# Patient Record
Sex: Female | Born: 1960 | Hispanic: Yes | Smoking: Former smoker
Health system: Southern US, Community
[De-identification: ages and names within clinical notes are randomized; demographics above are authoritative.]

## PROBLEM LIST (undated history)

## (undated) DIAGNOSIS — E7800 Pure hypercholesterolemia, unspecified: Secondary | ICD-10-CM

## (undated) HISTORY — DX: Pure hypercholesterolemia, unspecified: E78.00

## (undated) HISTORY — PX: TUBAL LIGATION: SHX77

---

## 2020-03-12 ENCOUNTER — Other Ambulatory Visit: Payer: Self-pay

## 2020-03-12 ENCOUNTER — Telehealth: Payer: Self-pay | Admitting: Osteopathic Medicine

## 2020-03-12 ENCOUNTER — Ambulatory Visit (INDEPENDENT_AMBULATORY_CARE_PROVIDER_SITE_OTHER): Payer: 59 | Admitting: Osteopathic Medicine

## 2020-03-12 ENCOUNTER — Encounter: Payer: Self-pay | Admitting: Osteopathic Medicine

## 2020-03-12 VITALS — BP 130/81 | HR 68 | Temp 98.1°F | Ht 60.0 in | Wt 106.1 lb

## 2020-03-12 DIAGNOSIS — E7849 Other hyperlipidemia: Secondary | ICD-10-CM | POA: Insufficient documentation

## 2020-03-12 DIAGNOSIS — M810 Age-related osteoporosis without current pathological fracture: Secondary | ICD-10-CM | POA: Diagnosis not present

## 2020-03-12 DIAGNOSIS — L989 Disorder of the skin and subcutaneous tissue, unspecified: Secondary | ICD-10-CM

## 2020-03-12 DIAGNOSIS — Z720 Tobacco use: Secondary | ICD-10-CM | POA: Insufficient documentation

## 2020-03-12 DIAGNOSIS — D7589 Other specified diseases of blood and blood-forming organs: Secondary | ICD-10-CM

## 2020-03-12 DIAGNOSIS — Z9851 Tubal ligation status: Secondary | ICD-10-CM | POA: Insufficient documentation

## 2020-03-12 DIAGNOSIS — E039 Hypothyroidism, unspecified: Secondary | ICD-10-CM | POA: Diagnosis not present

## 2020-03-12 DIAGNOSIS — R634 Abnormal weight loss: Secondary | ICD-10-CM | POA: Insufficient documentation

## 2020-03-12 DIAGNOSIS — R05 Cough: Secondary | ICD-10-CM | POA: Insufficient documentation

## 2020-03-12 DIAGNOSIS — R053 Chronic cough: Secondary | ICD-10-CM | POA: Insufficient documentation

## 2020-03-12 NOTE — Telephone Encounter (Signed)
Lauren Velasquez out of office.  Francesco Runner, since Dr Ashley Royalty is out this week also, can you work on this Percell Locus for patient please?   Thanks!

## 2020-03-12 NOTE — Progress Notes (Signed)
Lauren Velasquez is a 59 y.o. female who presents to  [facility] at [facility]  today, 03/12/20, seeking care for the following: . Establish care . See headings below      ASSESSMENT & PLAN with other pertinent history/findings:  The primary encounter diagnosis was Hypothyroidism, unspecified type. Diagnoses of Osteoporosis without current pathological fracture, unspecified osteoporosis type, Other hyperlipidemia, Skin lesion, Unintended weight loss, Tobacco use, History of bilateral tubal ligation, and Persistent dry cough were also pertinent to this visit.    1. Hypothyroidism, unspecified type Checking TSH No need for refills right now  2. Osteoporosis without current pathological fracture, unspecified osteoporosis type Due for DEXA Patient would like to try Prolia instead of Fosamax.  Often forgets to take weekly dose of Fosamax, not really interested in adding another daily medication at this time  3. Other hyperlipidemia Intolerant to statins  4. Skin lesion Benign mole on right side of neck, okay to monitor  5. Unintended weight loss Patient attributes this to low appetite due to grief after the death of her husband last year in May 2020 due to complications from COPD  6. Tobacco use Smoker, less than 1/2 pack/day for 30 years.  She would like to quit.  Planning on quitting this summer.  7. Persistent dry cough No shortness of breath, no mucus production, patient is a light smoker and attributes cough to this.  Would like to try quitting smoking before addressing cough issues.  Not meeting criteria for lung cancer screening at this time.       There are no Patient Instructions on file for this visit.   Orders Placed This Encounter  Procedures  . DG Bone Density  . CBC  . COMPLETE METABOLIC PANEL WITH GFR  . Lipid panel  . TSH  . VITAMIN D 25 Hydroxy (Vit-D Deficiency, Fractures)  . Urinalysis, Routine w reflex  microscopic    No orders of the defined types were placed in this encounter.      Follow-up instructions: Return in about 1 year (around 03/12/2021) for [place] (call week prior to visit for lab orders).              Constitutional:  . VSS, see nurse notes . General Appearance: alert, well-developed, well-nourished, NAD Eyes: Marland Kitchen Normal lids and conjunctive, non-icteric sclera . PERRLA Ears, Nose, Mouth, Throat: . Normal external auditory canal and TM bilaterally Neck: . No masses, trachea midline . No thyroid enlargement/tenderness/mass appreciated Respiratory: . Normal respiratory effort . No dullness/hyper-resonance to percussion . Breath sounds normal, no wheeze/rhonchi/rales Cardiovascular: . S1/S2 normal, no murmur/rub/gallop auscultated . No carotid bruit or JVD . No lower extremity edema Gastrointestinal: . Nontender, no masses . No hepatomegaly, no splenomegaly . No hernia appreciated Musculoskeletal:  . Gait normal . No clubbing/cyanosis of digits Neurological: . No cranial nerve deficit on limited exam . Motor and sensation intact and symmetric Psychiatric: . Normal judgment/insight . Normal mood and affect                            BP 130/81 (BP Location: Left Arm, Patient Position: Sitting, Cuff Size: Normal)   Pulse 68   Temp 98.1 F (36.7 C) (Oral)   Ht 5' (1.524 m)   Wt 106 lb 1.9 oz (48.1 kg)   BMI 20.73 kg/m   Current Meds  Medication Sig  . alendronate (FOSAMAX) 70 MG tablet Take 70 mg by  mouth once a week. Take with a full glass of water on an empty stomach.  . levothyroxine (SYNTHROID) 88 MCG tablet Take 88 mcg by mouth every morning.    No results found for this or any previous visit (from the past 72 hour(s)).  No results found.  Depression screen [facility] 2/9 03/12/2020  Decreased Interest 0  Down, Depressed, Hopeless 0  PHQ - 2 Score 0  Altered sleeping 0  Tired, decreased energy 0  Change in  appetite 0  Feeling bad or failure about yourself  0  Trouble concentrating 0  Moving slowly or fidgety/restless 0  Suicidal thoughts 0  PHQ-9 Score 0    GAD 7 : Generalized Anxiety Score 03/12/2020  Nervous, Anxious, on Edge 0  Control/stop worrying 0  Worry too much - different things 0  Trouble relaxing 0  Restless 0  Easily annoyed or irritable 0  Afraid - awful might happen 0  Total GAD 7 Score 0      All questions at time of visit were answered - patient instructed to contact office with any additional concerns or updates.  ER/RTC precautions were reviewed with the patient.  Please note: voice recognition software was used to produce this document, and typos may escape review. Please contact Dr. Sheppard Coil for any needed clarifications.

## 2020-03-12 NOTE — Telephone Encounter (Signed)
Can we get Prolia ordered for this patient? Please and thanks!

## 2020-03-13 ENCOUNTER — Other Ambulatory Visit: Payer: Self-pay | Admitting: Osteopathic Medicine

## 2020-03-13 LAB — LIPID PANEL
Cholesterol: 233 mg/dL — ABNORMAL HIGH (ref <200)
HDL: 58 mg/dL (ref >=50)
LDL Cholesterol (Calc): 155 mg/dL (calc) — ABNORMAL HIGH
Non-HDL Cholesterol (Calc): 175 mg/dL (calc) — ABNORMAL HIGH (ref <130)
Total CHOL/HDL Ratio: 4 (calc) (ref <5.0)
Triglycerides: 91 mg/dL (ref <150)

## 2020-03-13 LAB — CBC
HCT: 40.7 % (ref 35.0–45.0)
Hemoglobin: 14 g/dL (ref 11.7–15.5)
MCH: 37.4 pg — ABNORMAL HIGH (ref 27.0–33.0)
MCHC: 34.4 g/dL (ref 32.0–36.0)
MCV: 108.8 fL — ABNORMAL HIGH (ref 80.0–100.0)
MPV: 10.1 fL (ref 7.5–12.5)
Platelets: 306 10*3/uL (ref 140–400)
RBC: 3.74 10*6/uL — ABNORMAL LOW (ref 3.80–5.10)
RDW: 13.7 % (ref 11.0–15.0)
WBC: 6.2 10*3/uL (ref 3.8–10.8)

## 2020-03-13 LAB — URINALYSIS, ROUTINE W REFLEX MICROSCOPIC
Bilirubin Urine: NEGATIVE
Glucose, UA: NEGATIVE
Hgb urine dipstick: NEGATIVE
Ketones, ur: NEGATIVE
Leukocytes,Ua: NEGATIVE
Nitrite: NEGATIVE
Protein, ur: NEGATIVE
Specific Gravity, Urine: 1.013 (ref 1.001–1.03)
pH: 7.5 (ref 5.0–8.0)

## 2020-03-13 LAB — COMPLETE METABOLIC PANEL WITH GFR
AG Ratio: 2 (calc) (ref 1.0–2.5)
ALT: 10 U/L (ref 6–29)
AST: 12 U/L (ref 10–35)
Albumin: 4.4 g/dL (ref 3.6–5.1)
Alkaline phosphatase (APISO): 68 U/L (ref 37–153)
BUN: 13 mg/dL (ref 7–25)
CO2: 30 mmol/L (ref 20–32)
Calcium: 9.3 mg/dL (ref 8.6–10.4)
Chloride: 102 mmol/L (ref 98–110)
Creat: 0.59 mg/dL (ref 0.50–1.05)
GFR, Est African American: 116 mL/min/{1.73_m2} (ref >=60)
GFR, Est Non African American: 100 mL/min/{1.73_m2} (ref >=60)
Globulin: 2.2 g/dL (calc) (ref 1.9–3.7)
Glucose, Bld: 97 mg/dL (ref 65–99)
Potassium: 4.4 mmol/L (ref 3.5–5.3)
Sodium: 140 mmol/L (ref 135–146)
Total Bilirubin: 0.4 mg/dL (ref 0.2–1.2)
Total Protein: 6.6 g/dL (ref 6.1–8.1)

## 2020-03-13 LAB — TSH: TSH: 0.39 mIU/L — ABNORMAL LOW (ref 0.40–4.50)

## 2020-03-13 LAB — VITAMIN D 25 HYDROXY (VIT D DEFICIENCY, FRACTURES): Vit D, 25-Hydroxy: 19 ng/mL — ABNORMAL LOW (ref 30–100)

## 2020-03-13 MED ORDER — LEVOTHYROXINE SODIUM 75 MCG PO TABS: 75.0000 ug | ORAL_TABLET | Freq: Every morning | ORAL | 0 refills | Status: DC

## 2020-03-17 ENCOUNTER — Telehealth: Payer: Self-pay | Admitting: *Deleted

## 2020-03-17 MED ORDER — VITAMIN D (ERGOCALCIFEROL) 1.25 MG (50000 UNIT) PO CAPS: 50000.0000 [IU] | ORAL_CAPSULE | ORAL | 0 refills | Status: DC

## 2020-03-17 NOTE — Telephone Encounter (Signed)
Received a message from Fonda, certified tech at [employer] asking for clarification on patient's synthroid.  Per chart patient is supposed to be taking of synthroid.  [employer] went ahead and discontinued her old dose of and will fill the correct dose and mail to patient. Kaidon Kinker,CMA

## 2020-03-17 NOTE — Addendum Note (Signed)
Addended by: Deirdre Pippins on: 03/17/2020 05:05 PM   Modules accepted: Orders

## 2020-03-18 NOTE — Telephone Encounter (Signed)
I have submitted the information to insurance and waiting on a response.   

## 2020-03-19 NOTE — Telephone Encounter (Signed)
[name] with [facility] is processing the script for Prolia and needs the provider's NPI number.  I called back and gave him pcp's npi number.  [name] said that he just sent a fax over for chart notes on this medication.  Will forward to Dr. Mardelle Matte cma to make her aware.  Shailene Demonbreun,CMA

## 2020-03-23 ENCOUNTER — Telehealth: Payer: Self-pay

## 2020-03-23 NOTE — Telephone Encounter (Signed)
Pt lvm stating she had her COVID vaccine. She now has a rash on the arm. Seems to be subsiding now.

## 2020-03-25 ENCOUNTER — Ambulatory Visit (INDEPENDENT_AMBULATORY_CARE_PROVIDER_SITE_OTHER): Payer: 59

## 2020-03-25 ENCOUNTER — Telehealth: Payer: Self-pay

## 2020-03-25 ENCOUNTER — Other Ambulatory Visit: Payer: Self-pay

## 2020-03-25 DIAGNOSIS — E039 Hypothyroidism, unspecified: Secondary | ICD-10-CM | POA: Diagnosis not present

## 2020-03-25 DIAGNOSIS — M810 Age-related osteoporosis without current pathological fracture: Secondary | ICD-10-CM

## 2020-03-25 NOTE — Telephone Encounter (Signed)
We received a fax about a PA for Prolia. We need medical records of a Dexa bone scan and a history of patient taking Flomax and Actonel in the past. Tylisha did not sign a medical release form. I called patient and asked her to stop by to sign a medical release form.   Paperwork is on my desk.

## 2020-04-27 ENCOUNTER — Telehealth: Payer: Self-pay

## 2020-04-27 NOTE — Telephone Encounter (Signed)
Pt left a vm msg regarding high dose Vit D rx. Per pt she has been having side effects since she started taking the rx. Bone pain, weakness, lost of taste and loss of appetite. She wants to know whether she should continue taking the rx. Pls advise, thanks.

## 2020-04-27 NOTE — Telephone Encounter (Signed)
Couldn't say this would be due to Rx, very uncommon. Can stop it but if she's concerned she needs a visit to address these symptoms

## 2020-04-28 NOTE — Telephone Encounter (Signed)
Left a detailed vm msg for pt regarding provider's note. Pt is aware she can stop taking the medication if she wants. Direct call back info provided.

## 2020-05-02 ENCOUNTER — Other Ambulatory Visit: Payer: Self-pay | Admitting: Osteopathic Medicine

## 2020-05-28 ENCOUNTER — Other Ambulatory Visit: Payer: Self-pay | Admitting: Osteopathic Medicine

## 2020-06-30 ENCOUNTER — Telehealth: Payer: Self-pay

## 2020-06-30 NOTE — Telephone Encounter (Signed)
Patient contacted office about thyroid medication being refilled. We are unable to refill at this time to the dosage concerns. Patient had thyroid checked 04/001/2021. Provider requested patient get thyroid checked again to ensure dosage was to high. Patient never got the labs done. Patient was advised that orders were in get thyroid checked. Patient verbalized her understanding.

## 2020-07-01 MED ORDER — LEVOTHYROXINE SODIUM 75 MCG PO TABS: 75.0000 ug | ORAL_TABLET | Freq: Every morning | ORAL | 0 refills | Status: DC

## 2020-07-01 NOTE — Telephone Encounter (Signed)
Back but sent 15-day supply, patient needs labs done before additional refills will be authorized.

## 2020-07-02 LAB — B12 AND FOLATE PANEL
Folate: >24 ng/mL
Vitamin B-12: 198 pg/mL — ABNORMAL LOW (ref 200–1100)

## 2020-07-02 NOTE — Telephone Encounter (Signed)
Patient advised getting labs done.

## 2020-07-03 ENCOUNTER — Other Ambulatory Visit: Payer: Self-pay | Admitting: Osteopathic Medicine

## 2020-07-03 DIAGNOSIS — E538 Deficiency of other specified B group vitamins: Secondary | ICD-10-CM

## 2020-07-08 LAB — TEST AUTHORIZATION

## 2020-07-08 LAB — METHYLMALONIC ACID, SERUM: Methylmalonic Acid, Quant: 10500 nmol/L — ABNORMAL HIGH (ref 87–318)

## 2020-07-08 LAB — TSH: TSH: 17.19 mIU/L — ABNORMAL HIGH (ref 0.40–4.50)

## 2020-07-23 ENCOUNTER — Ambulatory Visit: Payer: 59 | Admitting: Osteopathic Medicine

## 2020-07-29 ENCOUNTER — Ambulatory Visit (INDEPENDENT_AMBULATORY_CARE_PROVIDER_SITE_OTHER): Payer: 59 | Admitting: Osteopathic Medicine

## 2020-07-29 ENCOUNTER — Encounter: Payer: Self-pay | Admitting: Osteopathic Medicine

## 2020-07-29 VITALS — BP 145/86 | HR 74 | Temp 98.2°F | Wt 110.0 lb

## 2020-07-29 DIAGNOSIS — E039 Hypothyroidism, unspecified: Secondary | ICD-10-CM | POA: Diagnosis not present

## 2020-07-29 DIAGNOSIS — E538 Deficiency of other specified B group vitamins: Secondary | ICD-10-CM

## 2020-07-29 DIAGNOSIS — E559 Vitamin D deficiency, unspecified: Secondary | ICD-10-CM | POA: Diagnosis not present

## 2020-07-29 DIAGNOSIS — Z23 Encounter for immunization: Secondary | ICD-10-CM

## 2020-07-29 MED ORDER — CYANOCOBALAMIN 1000 MCG/ML IJ SOLN
1000.0000 ug | Freq: Once | INTRAMUSCULAR | Status: AC
  Administered 2020-07-29: 1000 ug via INTRAMUSCULAR

## 2020-07-29 MED ORDER — LEVOTHYROXINE SODIUM 75 MCG PO TABS: 75.0000 ug | ORAL_TABLET | Freq: Every morning | ORAL | 0 refills | Status: DC

## 2020-07-29 MED ORDER — ALENDRONATE SODIUM 70 MG PO TABS: 70.0000 mg | ORAL_TABLET | ORAL | 3 refills | Status: DC

## 2020-07-29 NOTE — Patient Instructions (Addendum)
Plan: --> B12 injections weekly for 4 weeks, then transition to high-dose oral supplement (2000 mcg daily). If levels go to normal, can continue with daily oral supplement and recheck again in a few months. If levels go back down, would put you back onto injections once per month.  --> Levothyroxine 75 mcg daily, NO skipped days!  --> Will recheck all labs in 6-8 weeks      Vitamin B12 Deficiency Vitamin B12 deficiency occurs when the body does not have enough vitamin B12, which is an important vitamin. The body needs this vitamin:  To make red blood cells.  To make DNA. This is the genetic material inside cells.  To help the nerves work properly so they can carry messages from the brain to the body. Vitamin B12 deficiency can cause various health problems, such as a low red blood cell count (anemia) or nerve damage. What are the causes? This condition may be caused by:  Not eating enough foods that contain vitamin B12.  Not having enough stomach acid and digestive fluids to properly absorb vitamin B12 from the food that you eat.  Certain digestive system diseases that make it hard to absorb vitamin B12. These diseases include Crohn's disease, chronic pancreatitis, and cystic fibrosis.  A condition in which the body does not make enough of a protein (intrinsic factor), resulting in too few red blood cells (pernicious anemia).  Having a surgery in which part of the stomach or small intestine is removed.  Taking certain medicines that make it hard for the body to absorb vitamin B12. These medicines include: ? Heartburn medicines (antacids and proton pump inhibitors). ? Certain antibiotic medicines. ? Some medicines that are used to treat diabetes, tuberculosis, gout, or high cholesterol. What increases the risk? The following factors may make you more likely to develop a B12 deficiency:  Being older than age 48.  Eating a vegetarian or vegan diet, especially while you are  pregnant.  Eating a poor diet while you are pregnant.  Taking certain medicines.  Having alcoholism. What are the signs or symptoms? In some cases, there are no symptoms of this condition. If the condition leads to anemia or nerve damage, various symptoms can occur, such as:  Weakness.  Fatigue.  Loss of appetite.  Weight loss.  Numbness or tingling in your hands and feet.  Redness and burning of the tongue.  Confusion or memory problems.  Depression.  Sensory problems, such as color blindness, ringing in the ears, or loss of taste.  Diarrhea or constipation.  Trouble walking. If anemia is severe, symptoms can include:  Shortness of breath.  Dizziness.  Rapid heart rate (tachycardia). How is this diagnosed? This condition may be diagnosed with a blood test to measure the level of vitamin B12 in your blood. You may also have other tests, including:  A group of tests that measure certain characteristics of blood cells (complete blood count, CBC).  A blood test to measure intrinsic factor.  A procedure where a thin tube with a camera on the end is used to look into your stomach or intestines (endoscopy). Other tests may be needed to discover the cause of B12 deficiency. How is this treated? Treatment for this condition depends on the cause. This condition may be treated by:  Changing your eating and drinking habits, such as: ? Eating more foods that contain vitamin B12. ? Drinking less alcohol or no alcohol.  Getting vitamin B12 injections.  Taking vitamin B12 supplements. Your health care  provider will tell you which dosage is best for you. Follow these instructions at home: Eating and drinking   Eat lots of healthy foods that contain vitamin B12, including: ? Meats and poultry. This includes beef, pork, chicken, [place], and organ meats, such as liver. ? Seafood. This includes clams, rainbow trout, salmon, tuna, and haddock. ? Eggs. ? Cereal and dairy  products that are fortified. This means that vitamin B12 has been added to the food. Check the label on the package to see if the food is fortified. The items listed above may not be a complete list of recommended foods and beverages. Contact a dietitian for more information. General instructions  Get any injections that are prescribed by your health care provider.  Take supplements only as told by your health care provider. Follow the directions carefully.  Do not drink alcohol if your health care provider tells you not to. In some cases, you may only be asked to limit alcohol use.  Keep all follow-up visits as told by your health care provider. This is important. Contact a health care provider if:  Your symptoms come back. Get help right away if you:  Develop shortness of breath.  Have a rapid heart rate.  Have chest pain.  Become dizzy or lose consciousness. Summary  Vitamin B12 deficiency occurs when the body does not have enough vitamin B12.  The main causes of vitamin B12 deficiency include dietary deficiency, digestive diseases, pernicious anemia, and having a surgery in which part of the stomach or small intestine is removed.  In some cases, there are no symptoms of this condition. If the condition leads to anemia or nerve damage, various symptoms can occur, such as weakness, shortness of breath, and numbness.  Treatment may include getting vitamin B12 injections or taking vitamin B12 supplements. Eat lots of healthy foods that contain vitamin B12. This information is not intended to replace advice given to you by your health care provider. Make sure you discuss any questions you have with your health care provider. Document Revised: 05/17/2019 Document Reviewed: 08/07/2018 Elsevier Patient Education  2020 [employer].

## 2020-07-29 NOTE — Progress Notes (Signed)
Lauren Velasquez is a 59 y.o. female who presents to  [facility] at [facility]  today, 07/29/20, seeking care for the following:   Go over labs, B12 deficiency, abnormal thyroid levels, vitamin D deficiency.  With last TSH being slightly below normal, we advise skip 1 dose of medication per week, TSH went up to 17.    ASSESSMENT & PLAN with other pertinent findings:  The primary encounter diagnosis was B12 deficiency. Diagnoses of Need for influenza vaccination, Need for shingles vaccine, Vitamin D deficiency, Hypothyroidism, unspecified type, and B12 deficiency with macrocytosis, elevated methylmalonic acid.  Pending intrinsic factor antibody evaluation were also pertinent to this visit.   No results found for this or any previous visit (from the past 24 hour(s)).     Patient Instructions  Plan: --> B12 injections weekly for 4 weeks, then transition to high-dose oral supplement (2000 mcg daily). If levels go to normal, can continue with daily oral supplement and recheck again in a few months. If levels go back down, would put you back onto injections once per month.  --> Levothyroxine 75 mcg daily, NO skipped days!  --> Will recheck all labs in 6-8 weeks    Additional information printed regarding B12 deficiency/possible pernicious anemia.     Orders Placed This Encounter  Procedures  . Varicella-zoster vaccine IM (Shingrix)  . Flu Vaccine QUAD 6+ mos PF IM (Fluarix Quad PF)  . Intrinsic Factor Antibodies  . TSH  . Vitamin B12  . VITAMIN D 25 Hydroxy (Vit-D Deficiency, Fractures)  . CBC    Meds ordered this encounter  Medications  . cyanocobalamin ((VITAMIN B-12)) injection 1,000 mcg  . levothyroxine (SYNTHROID) 75 MCG tablet    Sig: Take 1 tablet (75 mcg total) by mouth every morning.    Dispense:  90 tablet    Refill:  0  . alendronate (FOSAMAX) 70 MG tablet    Sig: Take 1 tablet (70 mg total) by mouth  once a week. Take with a full glass of water on an empty stomach.    Dispense:  12 tablet    Refill:  3       Follow-up instructions: Return for nurse visit B12 injections: 1 week, 2 weeks, 3 weeks from today. LAB around 09/09/20 & 12/09/20.                                         BP (!) 145/86 (BP Location: Right Arm, Patient Position: Sitting, Cuff Size: Normal)   Pulse 74   Temp 98.2 F (36.8 C) (Oral)   Wt 110 lb (49.9 kg)   BMI 21.48 kg/m   Current Meds  Medication Sig  . alendronate (FOSAMAX) 70 MG tablet Take 1 tablet (70 mg total) by mouth once a week. Take with a full glass of water on an empty stomach.  . levothyroxine (SYNTHROID) 75 MCG tablet Take 1 tablet (75 mcg total) by mouth every morning.  . Vitamin D, Ergocalciferol, (DRISDOL) 1.25 MG (50000 UNIT) CAPS capsule Take 1 capsule (50,000 Units total) by mouth every 7 (seven) days. Take for 12 total doses(weeks) than can transition to 1000 units OTC supplement daily  . [DISCONTINUED] alendronate (FOSAMAX) 70 MG tablet Take 70 mg by mouth once a week. Take with a full glass of water on an empty stomach.  . [DISCONTINUED] levothyroxine (SYNTHROID) 75 MCG tablet  Take 1 tablet (75 mcg total) by mouth every morning. Skip 1 dose per week **PATIENT NEEDS LABS DONE FOR ADDITIONAL REFILLS**    No results found for this or any previous visit (from the past 72 hour(s)).  No results found.     All questions at time of visit were answered - patient instructed to contact office with any additional concerns or updates.  ER/RTC precautions were reviewed with the patient as applicable.   Please note: voice recognition software was used to produce this document, and typos may escape review. Please contact Dr. Lyn Hollingshead for any needed clarifications.   Total encounter time: 30 minutes.

## 2020-08-05 ENCOUNTER — Encounter: Payer: Self-pay | Admitting: Osteopathic Medicine

## 2020-08-05 ENCOUNTER — Ambulatory Visit (INDEPENDENT_AMBULATORY_CARE_PROVIDER_SITE_OTHER): Payer: 59 | Admitting: Osteopathic Medicine

## 2020-08-05 VITALS — BP 152/91 | HR 72 | Temp 98.0°F

## 2020-08-05 DIAGNOSIS — E538 Deficiency of other specified B group vitamins: Secondary | ICD-10-CM

## 2020-08-05 MED ORDER — CYANOCOBALAMIN 1000 MCG/ML IJ SOLN
1000.0000 ug | Freq: Once | INTRAMUSCULAR | Status: AC
  Administered 2020-08-05: 1000 ug via INTRAMUSCULAR

## 2020-08-05 NOTE — Progress Notes (Signed)
Patient presents today for cyanocobalamin 1000 mcg/1 ml injection. Patient is scheduled to get this injection every week for the next 4 weeks. This is the patients first injection.   Patient denies CP, palpitations, ShOB, dizziness/lightheadedness, abdominal pain, headache, and mood swings.   Pt's BP was elevated upon intake. She stated that her BP was elevated the last time that she was here. I told her that we would recheck her BP and pt declined recheck.   Injection given in left arm. Pt tolerated injection well without complications. Pt is already scheduled to return on 08/12/2020 for her next injection. Marland Kitchen

## 2020-08-12 ENCOUNTER — Other Ambulatory Visit: Payer: Self-pay

## 2020-08-12 ENCOUNTER — Ambulatory Visit (INDEPENDENT_AMBULATORY_CARE_PROVIDER_SITE_OTHER): Payer: 59 | Admitting: Osteopathic Medicine

## 2020-08-12 VITALS — BP 132/74 | HR 77 | Ht 60.0 in | Wt 110.0 lb

## 2020-08-12 DIAGNOSIS — E538 Deficiency of other specified B group vitamins: Secondary | ICD-10-CM

## 2020-08-12 MED ORDER — CYANOCOBALAMIN 1000 MCG/ML IJ SOLN
1000.0000 ug | Freq: Once | INTRAMUSCULAR | Status: AC
  Administered 2020-08-12: 1000 ug via INTRAMUSCULAR

## 2020-08-12 NOTE — Progress Notes (Signed)
   Subjective:    Patient ID: Lauren Velasquez, female    DOB: November 13, 1961, 59 y.o.   MRN: [id number]  HPI Patient is here for a Vitamin B12 injection. Denied any gastrointestional problems or dizziness.    Review of Systems     Objective:   Physical Exam        Assessment & Plan:  Patient tolerated injection well without complication. Patient aware to come back for lab re-check in 2-4 weeks

## 2020-08-19 ENCOUNTER — Ambulatory Visit (INDEPENDENT_AMBULATORY_CARE_PROVIDER_SITE_OTHER): Payer: 59 | Admitting: Osteopathic Medicine

## 2020-08-19 VITALS — BP 140/68 | HR 71 | Wt 110.0 lb

## 2020-08-19 DIAGNOSIS — E538 Deficiency of other specified B group vitamins: Secondary | ICD-10-CM

## 2020-08-19 MED ORDER — CYANOCOBALAMIN 1000 MCG/ML IJ SOLN
1000.0000 ug | Freq: Once | INTRAMUSCULAR | Status: AC
  Administered 2020-08-19: 1000 ug via INTRAMUSCULAR

## 2020-08-19 NOTE — Progress Notes (Signed)
Established Patient Office Visit  Subjective:  Patient ID: Lauren Velasquez, female    DOB: June 20, 1961  Age: 59 y.o. MRN: [id number]  CC:  Chief Complaint  Patient presents with  . Pernicious Anemia    HPI  Glory Rosebush is here for a vitamin B 12 injection. Denies muscle cramps, weakness or irregular heart rate.   Past Medical History:  Diagnosis Date  . High cholesterol     Past Surgical History:  Procedure Laterality Date  . TUBAL LIGATION Bilateral     Family History  Family history unknown: Yes    Social History   Socioeconomic History  . Marital status: Widowed    Spouse name: Not on file  . Number of children: 2  . Years of education: Not on file  . Highest education level: Not on file  Occupational History  . Not on file  Tobacco Use  . Smoking status: Former Smoker    Types: Cigarettes    Quit date: 1991    Years since quitting: 30.7  . Smokeless tobacco: Never Used  Vaping Use  . Vaping Use: Never used  Substance and Sexual Activity  . Alcohol use: Yes  . Drug use: Never  . Sexual activity: Not Currently    Partners: Male    Birth control/protection: Post-menopausal, Abstinence  Other Topics Concern  . Not on file  Social History Narrative  . Not on file   Social Determinants of Health   Financial Resource Strain:   . Difficulty of Paying Living Expenses: Not on file  Food Insecurity:   . Worried About Programme researcher, broadcasting/film/video in the Last Year: Not on file  . Ran Out of Food in the Last Year: Not on file  Transportation Needs:   . Lack of Transportation (Medical): Not on file  . Lack of Transportation (Non-Medical): Not on file  Physical Activity:   . Days of Exercise per Week: Not on file  . Minutes of Exercise per Session: Not on file  Stress:   . Feeling of Stress : Not on file  Social Connections:   . Frequency of Communication with Friends and Family: Not on file  . Frequency of Social Gatherings with  Friends and Family: Not on file  . Attends Religious Services: Not on file  . Active Member of Clubs or Organizations: Not on file  . Attends Banker Meetings: Not on file  . Marital Status: Not on file  Intimate Partner Violence:   . Fear of Current or Ex-Partner: Not on file  . Emotionally Abused: Not on file  . Physically Abused: Not on file  . Sexually Abused: Not on file    Outpatient Medications Prior to Visit  Medication Sig Dispense Refill  . alendronate (FOSAMAX) 70 MG tablet Take 1 tablet (70 mg total) by mouth once a week. Take with a full glass of water on an empty stomach. 12 tablet 3  . cyanocobalamin (,VITAMIN B-12,) 1000 MCG/ML injection Inject 1,000 mcg into the muscle once a week.    . levothyroxine (SYNTHROID) 75 MCG tablet Take 1 tablet (75 mcg total) by mouth every morning. 90 tablet 0  . Vitamin D, Ergocalciferol, (DRISDOL) 1.25 MG (50000 UNIT) CAPS capsule Take 1 capsule (50,000 Units total) by mouth every 7 (seven) days. Take for 12 total doses(weeks) than can transition to 1000 units OTC supplement daily 12 capsule 0   No facility-administered medications prior to visit.    No Known Allergies  ROS Review of Systems    Objective:    Physical Exam  BP 140/68   Pulse 71   Wt 110 lb (49.9 kg)   SpO2 100%   BMI 21.48 kg/m  Wt Readings from Last 3 Encounters:  08/19/20 110 lb (49.9 kg)  08/12/20 110 lb (49.9 kg)  07/29/20 110 lb (49.9 kg)     Health Maintenance Due  Topic Date Due  . Hepatitis C Screening  Never done  . COVID-19 Vaccine (1) Never done  . HIV Screening  Never done  . PAP SMEAR-Modifier  Never done  . MAMMOGRAM  Never done  . COLONOSCOPY  Never done    There are no preventive care reminders to display for this patient.  Lab Results  Component Value Date   TSH 17.19 (H) 07/02/2020   Lab Results  Component Value Date   WBC 6.2 03/12/2020   HGB 14.0 03/12/2020   HCT 40.7 03/12/2020   MCV 108.8 (H) 03/12/2020    PLT 306 03/12/2020   Lab Results  Component Value Date   NA 140 03/12/2020   K 4.4 03/12/2020   CO2 30 03/12/2020   GLUCOSE 97 03/12/2020   BUN 13 03/12/2020   CREATININE 0.59 03/12/2020   BILITOT 0.4 03/12/2020   AST 12 03/12/2020   ALT 10 03/12/2020   PROT 6.6 03/12/2020   CALCIUM 9.3 03/12/2020   Lab Results  Component Value Date   CHOL 233 (H) 03/12/2020   Lab Results  Component Value Date   HDL 58 03/12/2020   Lab Results  Component Value Date   LDLCALC 155 (H) 03/12/2020   Lab Results  Component Value Date   TRIG 91 03/12/2020   Lab Results  Component Value Date   CHOLHDL 4.0 03/12/2020   No results found for: HGBA1C    Assessment & Plan:  B12 deficiency - Patient tolerated injection well without complications. Patient advised to schedule next injection 7 days from today. Advised to go to the lab before next injection.    Problem List Items Addressed This Visit    B12 deficiency with macrocytosis, elevated methylmalonic acid.  Pending intrinsic factor antibody evaluation - Primary      Meds ordered this encounter  Medications  . cyanocobalamin ((VITAMIN B-12)) injection 1,000 mcg    Follow-up: Return in about 1 week (around 08/26/2020) for B12 injection. Earna Coder, Janalyn Harder, CMA

## 2020-08-29 LAB — CBC
HCT: 43.6 % (ref 35.0–45.0)
Hemoglobin: 15 g/dL (ref 11.7–15.5)
MCH: 32.3 pg (ref 27.0–33.0)
MCHC: 34.4 g/dL (ref 32.0–36.0)
MCV: 94 fL (ref 80.0–100.0)
MPV: 11.3 fL (ref 7.5–12.5)
Platelets: 276 10*3/uL (ref 140–400)
RBC: 4.64 10*6/uL (ref 3.80–5.10)
RDW: 13.3 % (ref 11.0–15.0)
WBC: 5.4 10*3/uL (ref 3.8–10.8)

## 2020-08-29 LAB — INTRINSIC FACTOR ANTIBODIES: Intrinsic Factor: NEGATIVE

## 2020-08-29 LAB — TSH: TSH: 15.59 mIU/L — ABNORMAL HIGH (ref 0.40–4.50)

## 2020-08-29 LAB — VITAMIN D 25 HYDROXY (VIT D DEFICIENCY, FRACTURES): Vit D, 25-Hydroxy: 43 ng/mL (ref 30–100)

## 2020-08-29 LAB — VITAMIN B12: Vitamin B-12: 392 pg/mL (ref 200–1100)

## 2020-09-01 ENCOUNTER — Other Ambulatory Visit: Payer: Self-pay | Admitting: Osteopathic Medicine

## 2020-09-01 DIAGNOSIS — E538 Deficiency of other specified B group vitamins: Secondary | ICD-10-CM

## 2020-09-01 DIAGNOSIS — E039 Hypothyroidism, unspecified: Secondary | ICD-10-CM

## 2020-09-01 MED ORDER — LEVOTHYROXINE SODIUM 100 MCG PO TABS
100.0000 ug | ORAL_TABLET | Freq: Every morning | ORAL | 0 refills | Status: DC
Start: 2020-09-01 — End: 2020-10-08

## 2020-10-07 ENCOUNTER — Other Ambulatory Visit: Payer: Self-pay | Admitting: Osteopathic Medicine

## 2020-10-30 ENCOUNTER — Other Ambulatory Visit: Payer: Self-pay

## 2020-10-30 ENCOUNTER — Other Ambulatory Visit: Payer: Self-pay | Admitting: Osteopathic Medicine

## 2020-10-30 LAB — VITAMIN B12: Vitamin B-12: 211 pg/mL (ref 200–1100)

## 2020-10-30 LAB — TSH: TSH: 1.19 mIU/L (ref 0.40–4.50)

## 2020-10-30 MED ORDER — ALENDRONATE SODIUM 70 MG PO TABS
70.0000 mg | ORAL_TABLET | ORAL | 3 refills | Status: DC
Start: 2020-10-30 — End: 2021-10-11

## 2020-10-30 MED ORDER — LEVOTHYROXINE SODIUM 100 MCG PO TABS
ORAL_TABLET | ORAL | 3 refills | Status: DC
Start: 2020-10-30 — End: 2021-10-11

## 2021-05-28 ENCOUNTER — Telehealth: Payer: Self-pay

## 2021-05-28 NOTE — Telephone Encounter (Signed)
Patient left a vm msg stating she tested positive today for Covid. Per pt, has a slight cold. Denies any major symptoms. Pt is requesting antiviral medication. Please contact the patient to schedule an appointment with provider. Thanks in advance.

## 2021-05-31 ENCOUNTER — Telehealth (INDEPENDENT_AMBULATORY_CARE_PROVIDER_SITE_OTHER): Payer: 59 | Admitting: Family Medicine

## 2021-05-31 ENCOUNTER — Encounter: Payer: Self-pay | Admitting: Family Medicine

## 2021-05-31 DIAGNOSIS — U071 COVID-19: Secondary | ICD-10-CM

## 2021-05-31 NOTE — Telephone Encounter (Signed)
Patient has been scheduled for a virtual visit this morning with Hyman Hopes. AM

## 2021-05-31 NOTE — Progress Notes (Signed)
Virtual Video Visit via MyChart Note  I connected with  Lauren Velasquez on 05/31/21 at  9:10 AM EDT by the video enabled telemedicine application for MyChart, and verified that I am speaking with the correct person using two identifiers.   I introduced myself as a Publishing rights manager with the practice. We discussed the limitations of evaluation and management by telemedicine and the availability of in person appointments. The patient expressed understanding and agreed to proceed.  Participating parties in this visit include: The patient and the nurse practitioner listed.  The patient is: At home I am: In the office - Primary Care Kathryne Sharper  Subjective:    CC:  Chief Complaint  Patient presents with   Covid Positive    HPI: Lauren Velasquez is a 60 y.o. year old female presenting today via MyChart today for COVID infection.  Patient recently flew to and from [place] to sell a home. Reports she took a COVID test when she got back home and was positive (05/27/21). She states she has been feeling great- did have some mild cold symptoms initially, but denies any other symptoms. She is fully vaccinated plus booster.  She is asking how long she should quarantine and if she needs an antiviral.     Past medical history, Surgical history, Family history not pertinant except as noted below, Social history, Allergies, and medications have been entered into the medical record, reviewed, and corrections made.   Review of Systems:  All review of systems negative except what is listed in the HPI   Objective:    General:  Speaking clearly in complete sentences. Absent shortness of breath noted.   Alert and oriented x3.   Normal judgment.  Absent acute distress.   Impression and Recommendations:    1. COVID-19 Patient doing great, no symptoms other than mild nasal congestion. She is on day 5 since positive test and not high-risk. I do not think antivirals are  indicated - discussed risks vs benefits and indications, she agrees that it is not necessary. Repeat testing not indicated. She plans to isolate for the full 10 days before being around her grandchildren. Patient aware of signs/symptoms requiring further/urgent evaluation.    Follow-up if symptoms worsen or fail to improve.    I discussed the assessment and treatment plan with the patient. The patient was provided an opportunity to ask questions and all were answered. The patient agreed with the plan and demonstrated an understanding of the instructions.   The patient was advised to call back or seek an in-person evaluation if the symptoms worsen or if the condition fails to improve as anticipated.  I provided 20 minutes of non-face-to-face interaction with this MYCHART visit including intake, same-day documentation, and chart review.   Clayborne Dana, NP

## 2021-10-09 ENCOUNTER — Other Ambulatory Visit: Payer: Self-pay | Admitting: Osteopathic Medicine

## 2022-02-10 ENCOUNTER — Other Ambulatory Visit: Payer: Self-pay

## 2022-02-10 MED ORDER — ALENDRONATE SODIUM 70 MG PO TABS: 70.0000 mg | ORAL_TABLET | ORAL | 0 refills | Status: DC

## 2022-03-07 ENCOUNTER — Telehealth: Payer: Self-pay

## 2022-03-07 ENCOUNTER — Other Ambulatory Visit: Payer: Self-pay | Admitting: Osteopathic Medicine

## 2022-03-07 NOTE — Telephone Encounter (Signed)
Patient has been scheduled for a med refill appointment.gh ?

## 2022-03-07 NOTE — Telephone Encounter (Signed)
Previous Dr. Sheppard Coil patient ? ?Patient is scheduled for Transfer of Care appt on May 12 with Joy. Patient will be out of Synthroid before then. Can a refill be sent to get her to her appt or will she need to be scheduled for a med refill appt with any provider? ?

## 2022-03-16 ENCOUNTER — Ambulatory Visit (INDEPENDENT_AMBULATORY_CARE_PROVIDER_SITE_OTHER): Payer: 59 | Admitting: Physician Assistant

## 2022-03-16 ENCOUNTER — Encounter: Payer: Self-pay | Admitting: Physician Assistant

## 2022-03-16 VITALS — BP 135/63 | HR 56 | Ht 60.0 in | Wt 126.0 lb

## 2022-03-16 DIAGNOSIS — E039 Hypothyroidism, unspecified: Secondary | ICD-10-CM

## 2022-03-16 DIAGNOSIS — E559 Vitamin D deficiency, unspecified: Secondary | ICD-10-CM

## 2022-03-16 DIAGNOSIS — Z1231 Encounter for screening mammogram for malignant neoplasm of breast: Secondary | ICD-10-CM

## 2022-03-16 DIAGNOSIS — E538 Deficiency of other specified B group vitamins: Secondary | ICD-10-CM | POA: Diagnosis not present

## 2022-03-16 DIAGNOSIS — M810 Age-related osteoporosis without current pathological fracture: Secondary | ICD-10-CM

## 2022-03-16 DIAGNOSIS — E7849 Other hyperlipidemia: Secondary | ICD-10-CM

## 2022-03-16 DIAGNOSIS — Z79899 Other long term (current) drug therapy: Secondary | ICD-10-CM

## 2022-03-16 DIAGNOSIS — Z131 Encounter for screening for diabetes mellitus: Secondary | ICD-10-CM

## 2022-03-16 NOTE — Progress Notes (Signed)
? ?Subjective:  ? ? Patient ID: Lauren Velasquez, female    DOB: 09-12-1961, 61 y.o.   MRN: [id number] ? ?HPI ?Pt is a 61 yo female whose PCP left practice and she needed refills. She states "refills were sent so she doesn't know why she is here now". She has establish care with Lauren Butter NP on 5/12.  ? ?No problems or concerns.  ? ?.. ?Active Ambulatory Problems  ?  Diagnosis Date Noted  ? Hypothyroidism 03/12/2020  ? Osteoporosis without current pathological fracture 03/12/2020  ? Other hyperlipidemia 03/12/2020  ? Unintended weight loss 03/12/2020  ? Tobacco use 03/12/2020  ? History of bilateral tubal ligation 03/12/2020  ? Persistent dry cough 03/12/2020  ? B12 deficiency with macrocytosis, elevated methylmalonic acid.  Pending intrinsic factor antibody evaluation 07/29/2020  ? ?Resolved Ambulatory Problems  ?  Diagnosis Date Noted  ? No Resolved Ambulatory Problems  ? ?Past Medical History:  ?Diagnosis Date  ? High cholesterol   ? ? ? ? ? ?Review of Systems  ?All other systems reviewed and are negative. ? ?   ?Objective:  ? Physical Exam ?Vitals reviewed.  ?Constitutional:   ?   Appearance: Normal appearance.  ?HENT:  ?   Head: Normocephalic.  ?Cardiovascular:  ?   Rate and Rhythm: Normal rate and regular rhythm.  ?   Pulses: Normal pulses.  ?   Heart sounds: Normal heart sounds.  ?Musculoskeletal:  ?   Right lower leg: No edema.  ?   Left lower leg: No edema.  ?Neurological:  ?   General: No focal deficit present.  ?   Mental Status: She is alert and oriented to person, place, and time.  ?Psychiatric:     ?   Mood and Affect: Mood normal.  ? ?.. ? ?  03/16/2022  ? 10:44 AM 03/12/2020  ? 11:38 AM  ?Depression screen PHQ 2/9  ?Decreased Interest 0 0  ?Down, Depressed, Hopeless 0 0  ?PHQ - 2 Score 0 0  ?Altered sleeping  0  ?Tired, decreased energy  0  ?Change in appetite  0  ?Feeling bad or failure about yourself   0  ?Trouble concentrating  0  ?Moving slowly or fidgety/restless  0  ?Suicidal thoughts   0  ?PHQ-9 Score  0  ? ?.. ? ?  03/12/2020  ? 11:38 AM  ?GAD 7 : Generalized Anxiety Score  ?Nervous, Anxious, on Edge 0  ?Control/stop worrying 0  ?Worry too much - different things 0  ?Trouble relaxing 0  ?Restless 0  ?Easily annoyed or irritable 0  ?Afraid - awful might happen 0  ?Total GAD 7 Score 0  ? ? ? ? ? ? ?   ?Assessment & Plan:  ?..Ta was seen today for follow-up and hypothyroidism. ? ?Diagnoses and all orders for this visit: ? ?Hypothyroidism, unspecified type ?-     TSH ? ?B12 deficiency ?-     Vitamin B12 ? ?Vitamin D deficiency ?-     Vitamin D (25 hydroxy) ? ?Other hyperlipidemia ?-     Lipid Panel w/reflex Direct LDL ? ?Diabetes mellitus screening ?-     COMPLETE METABOLIC PANEL WITH GFR ? ?Medication management ?-     TSH ?-     Lipid Panel w/reflex Direct LDL ?-     COMPLETE METABOLIC PANEL WITH GFR ?-     CBC with Differential/Platelet ? ?Visit for screening mammogram ?-     MM DIGITAL SCREENING BILATERAL; Future ? ?Age-related  osteoporosis without current pathological fracture ?-     DG Bone Density; Future ? ? ?She is due for labs. Ordered today.  ?No refills needed but ok any refills until she sees Joy to establish care.  ?Discussed getting any records so we can up date EMR.  ?Mammogram and bone density ordered.  ?

## 2022-03-17 ENCOUNTER — Other Ambulatory Visit: Payer: Self-pay | Admitting: Physician Assistant

## 2022-03-17 DIAGNOSIS — E7849 Other hyperlipidemia: Secondary | ICD-10-CM

## 2022-03-17 LAB — CBC WITH DIFFERENTIAL/PLATELET
Absolute Monocytes: 347 cells/uL (ref 200–950)
Basophils Absolute: 48 cells/uL (ref 0–200)
Basophils Relative: 0.7 %
Eosinophils Absolute: 218 cells/uL (ref 15–500)
Eosinophils Relative: 3.2 %
HCT: 43 % (ref 35.0–45.0)
Hemoglobin: 14.3 g/dL (ref 11.7–15.5)
Lymphs Abs: 1802 cells/uL (ref 850–3900)
MCH: 31.3 pg (ref 27.0–33.0)
MCHC: 33.3 g/dL (ref 32.0–36.0)
MCV: 94.1 fL (ref 80.0–100.0)
MPV: 11.7 fL (ref 7.5–12.5)
Monocytes Relative: 5.1 %
Neutro Abs: 4386 cells/uL (ref 1500–7800)
Neutrophils Relative %: 64.5 %
Platelets: 277 10*3/uL (ref 140–400)
RBC: 4.57 10*6/uL (ref 3.80–5.10)
RDW: 12.8 % (ref 11.0–15.0)
Total Lymphocyte: 26.5 %
WBC: 6.8 10*3/uL (ref 3.8–10.8)

## 2022-03-17 LAB — LIPID PANEL W/REFLEX DIRECT LDL
Cholesterol: 258 mg/dL — ABNORMAL HIGH (ref <200)
HDL: 57 mg/dL (ref >=50)
LDL Cholesterol (Calc): 178 mg/dL (calc) — ABNORMAL HIGH
Non-HDL Cholesterol (Calc): 201 mg/dL (calc) — ABNORMAL HIGH (ref <130)
Total CHOL/HDL Ratio: 4.5 (calc) (ref <5.0)
Triglycerides: 106 mg/dL (ref <150)

## 2022-03-17 LAB — VITAMIN D 25 HYDROXY (VIT D DEFICIENCY, FRACTURES): Vit D, 25-Hydroxy: 33 ng/mL (ref 30–100)

## 2022-03-17 LAB — COMPLETE METABOLIC PANEL WITH GFR
AG Ratio: 1.7 (calc) (ref 1.0–2.5)
ALT: 13 U/L (ref 6–29)
AST: 12 U/L (ref 10–35)
Albumin: 4.3 g/dL (ref 3.6–5.1)
Alkaline phosphatase (APISO): 86 U/L (ref 37–153)
BUN: 13 mg/dL (ref 7–25)
CO2: 31 mmol/L (ref 20–32)
Calcium: 9.7 mg/dL (ref 8.6–10.4)
Chloride: 103 mmol/L (ref 98–110)
Creat: 0.63 mg/dL (ref 0.50–1.05)
Globulin: 2.6 g/dL (calc) (ref 1.9–3.7)
Glucose, Bld: 91 mg/dL (ref 65–139)
Potassium: 4.4 mmol/L (ref 3.5–5.3)
Sodium: 139 mmol/L (ref 135–146)
Total Bilirubin: 0.4 mg/dL (ref 0.2–1.2)
Total Protein: 6.9 g/dL (ref 6.1–8.1)
eGFR: 101 mL/min/{1.73_m2} (ref >=60)

## 2022-03-17 LAB — TSH: TSH: 0.18 mIU/L — ABNORMAL LOW (ref 0.40–4.50)

## 2022-03-17 LAB — VITAMIN B12: Vitamin B-12: 1641 pg/mL — ABNORMAL HIGH (ref 200–1100)

## 2022-03-17 MED ORDER — LEVOTHYROXINE SODIUM 88 MCG PO TABS: 88.0000 ug | ORAL_TABLET | Freq: Every day | ORAL | 0 refills | Status: DC

## 2022-03-17 MED ORDER — ATORVASTATIN CALCIUM 10 MG PO TABS: 10.0000 mg | ORAL_TABLET | Freq: Every day | ORAL | 3 refills | Status: DC

## 2022-03-17 NOTE — Progress Notes (Signed)
TSH is too low meaning you are HYPER thyroid. Will decrease synthroid and recheck in 6-8 weeks.  ? ?Marland Kitchen.The 10-year ASCVD risk score (Arnett DK, et al., 2019) is: 4.8% ?  Values used to calculate the score: ?    Age: 61 years ?    Sex: Female ?    Is Non-Hispanic African American: No ?    Diabetic: No ?    Tobacco smoker: No ?    Systolic Blood Pressure: 135 mmHg ?    Is BP treated: No ?    HDL Cholesterol: 57 mg/dL ?    Total Cholesterol: 258 mg/dL ? ?CV risk is still under 7.5 percent despite elevated LDL. Continue to work on diet and exercise to keep cholesterol low. I would consider adding a low dose statin due to LDL increasing in the last year.  ? ?B12 is too elevated. Need to decreased to every other week B12 shots.  ? ?Continue on vitamin D. You are supposed to be taking the once weekly. I would add 1000 units D3 daily as well.

## 2022-03-17 NOTE — Addendum Note (Signed)
Addended byJomarie Longs on: 03/17/2022 11:00 AM ? ? Modules accepted: Orders ? ?

## 2022-03-19 ENCOUNTER — Other Ambulatory Visit: Payer: Self-pay | Admitting: Medical-Surgical

## 2022-03-30 ENCOUNTER — Other Ambulatory Visit: Payer: Self-pay | Admitting: Physician Assistant

## 2022-03-30 ENCOUNTER — Ambulatory Visit (INDEPENDENT_AMBULATORY_CARE_PROVIDER_SITE_OTHER): Payer: 59

## 2022-03-30 DIAGNOSIS — M810 Age-related osteoporosis without current pathological fracture: Secondary | ICD-10-CM | POA: Diagnosis not present

## 2022-03-30 MED ORDER — ALENDRONATE SODIUM 70 MG PO TABS: 70.0000 mg | ORAL_TABLET | ORAL | 3 refills | Status: DC

## 2022-03-30 NOTE — Progress Notes (Signed)
Bone density improved quite a bit. You are in osteopenia range now. Continue fosamax. Refills sent. Recheck in 2 years.

## 2022-04-21 ENCOUNTER — Ambulatory Visit (INDEPENDENT_AMBULATORY_CARE_PROVIDER_SITE_OTHER): Payer: 59

## 2022-04-21 DIAGNOSIS — Z1231 Encounter for screening mammogram for malignant neoplasm of breast: Secondary | ICD-10-CM | POA: Diagnosis not present

## 2022-04-22 ENCOUNTER — Ambulatory Visit (INDEPENDENT_AMBULATORY_CARE_PROVIDER_SITE_OTHER): Payer: 59 | Admitting: Medical-Surgical

## 2022-04-22 ENCOUNTER — Encounter: Payer: Self-pay | Admitting: Medical-Surgical

## 2022-04-22 VITALS — BP 117/76 | HR 67 | Resp 20 | Ht 60.0 in | Wt 129.4 lb

## 2022-04-22 DIAGNOSIS — E039 Hypothyroidism, unspecified: Secondary | ICD-10-CM

## 2022-04-22 DIAGNOSIS — E7849 Other hyperlipidemia: Secondary | ICD-10-CM | POA: Diagnosis not present

## 2022-04-22 DIAGNOSIS — Z7689 Persons encountering health services in other specified circumstances: Secondary | ICD-10-CM

## 2022-04-22 DIAGNOSIS — E538 Deficiency of other specified B group vitamins: Secondary | ICD-10-CM

## 2022-04-22 DIAGNOSIS — M810 Age-related osteoporosis without current pathological fracture: Secondary | ICD-10-CM

## 2022-04-22 NOTE — Progress Notes (Signed)
?  HPI with pertinent ROS:  ? ?CC: Transfer of care ? ?HPI: ?Pleasant 61 year old female presenting today to transfer care to a new PCP following: ? ?Hypothyroidism-saw one of our partners on 4/5 for refill appointment where she had her TSH checked.  Her TSH did come back low and her medication dose was changed.  She was instructed to have her TSH rechecked in 6 to 8 weeks.  This will be due in 1-3 weeks from now. ? ?Hyperlipidemia- started on lipitor 10mg  daily in early August. Tolerating the medication well without side effects. Workiing to eat a low fat diet. ? ?Vitamin B12-recently lowered her vitamin B12 dosing to every other day since her levels were high.  ? ?Osteoporosis-had a repeat DEXA scan on 4/19 with results showing improvement in T score.  Now classified as osteopenic. Doing a program for strengthening specifically set up for osteoporosis treatment.  ? ?I reviewed the past medical history, family history, social history, surgical history, and allergies today and no changes were needed.  Please see the problem list section below in epic for further details. ? ? ?Physical exam:  ? ?General: Well Developed, well nourished, and in no acute distress.  ?Neuro: Alert and oriented x3,.  ?HEENT: Normocephalic, atraumatic.  ?Skin: Warm and dry. ?Cardiac: Regular rate and rhythm, no murmurs rubs or gallops, no lower extremity edema.  ?Respiratory: Clear to auscultation bilaterally. Not using accessory muscles, speaking in full sentences. ? ?Impression and Recommendations:   ? ?1. Encounter to establish care ?Reviewed available information and discussed care concerns with patient.  ? ?2. Hypothyroidism, unspecified type ?Plan to recheck TSH in 1-3 weeks.  Discussed possibly switching from generic levothyroxine to brand name Synthroid see if this provides more stability in her TSH levels. ?- TSH ? ?3. Other hyperlipidemia ?Plan to check CMP and lipid panel with her TSH levels in 1 to 3 weeks.  Continue atorvastatin  10 mg daily. ?- COMPLETE METABOLIC PANEL WITH GFR ?- Lipid panel ? ?4. B12 deficiency with macrocytosis, elevated methylmalonic acid.  Pending intrinsic factor antibody evaluation ?Continue every other day dosing with vitamin B12 orally. ? ?5. Age-related osteoporosis without current pathological fracture ?Improvement in bone density.  Continue regular intentional exercise, calcium and vitamin D supplementation, and Fosamax as prescribed. ? ?Return for labs in 1-2 weeks; follow up on chronic diseases in 6 months (or sooner if needed). ?___________________________________________ ?Clearnce Sorrel, DNP, APRN, FNP-BC ?Primary Care and Sports Medicine ?[place] ?

## 2022-05-03 NOTE — Progress Notes (Signed)
Normal mammogram. Follow up in 1 year.

## 2022-05-27 LAB — LIPID PANEL
Cholesterol: 187 mg/dL (ref <200)
HDL: 56 mg/dL (ref >=50)
LDL Cholesterol (Calc): 107 mg/dL (calc) — ABNORMAL HIGH
Non-HDL Cholesterol (Calc): 131 mg/dL (calc) — ABNORMAL HIGH (ref <130)
Total CHOL/HDL Ratio: 3.3 (calc) (ref <5.0)
Triglycerides: 126 mg/dL (ref <150)

## 2022-05-27 LAB — COMPLETE METABOLIC PANEL WITH GFR
AG Ratio: 1.7 (calc) (ref 1.0–2.5)
ALT: 18 U/L (ref 6–29)
AST: 14 U/L (ref 10–35)
Albumin: 4.4 g/dL (ref 3.6–5.1)
Alkaline phosphatase (APISO): 80 U/L (ref 37–153)
BUN: 19 mg/dL (ref 7–25)
CO2: 26 mmol/L (ref 20–32)
Calcium: 9.4 mg/dL (ref 8.6–10.4)
Chloride: 105 mmol/L (ref 98–110)
Creat: 0.61 mg/dL (ref 0.50–1.05)
Globulin: 2.6 g/dL (calc) (ref 1.9–3.7)
Glucose, Bld: 91 mg/dL (ref 65–99)
Potassium: 4.3 mmol/L (ref 3.5–5.3)
Sodium: 140 mmol/L (ref 135–146)
Total Bilirubin: 0.3 mg/dL (ref 0.2–1.2)
Total Protein: 7 g/dL (ref 6.1–8.1)
eGFR: 102 mL/min/{1.73_m2} (ref >=60)

## 2022-05-27 LAB — TSH: TSH: 0.84 mIU/L (ref 0.40–4.50)

## 2022-05-28 ENCOUNTER — Other Ambulatory Visit: Payer: Self-pay | Admitting: Physician Assistant

## 2022-07-04 ENCOUNTER — Ambulatory Visit (INDEPENDENT_AMBULATORY_CARE_PROVIDER_SITE_OTHER): Payer: 59

## 2022-07-04 ENCOUNTER — Ambulatory Visit (INDEPENDENT_AMBULATORY_CARE_PROVIDER_SITE_OTHER): Payer: 59 | Admitting: Sports Medicine

## 2022-07-04 DIAGNOSIS — M65311 Trigger thumb, right thumb: Secondary | ICD-10-CM

## 2022-07-04 NOTE — Assessment & Plan Note (Signed)
This is a very pleasant 61 year old female, has a several week history of increasing pain volar right thumb localized closer to the MCP. Occasional triggering. Does have a history sometime ago of a laceration that resulted in numbness along the radial aspect of the thumb distally to the MCP, likely cut the common digital nerve, should pass cells on that. Due to severity of pain and the likely diagnosis of a trigger thumb we did a flexor pollicis longus tendon sheath injection, adding home conditioning, return to see me in 6 weeks.

## 2022-07-04 NOTE — Progress Notes (Signed)
    Procedures performed today:    Procedure: Real-time Ultrasound Guided injection of the right flexor pollicis longus tendon sheath Device: Samsung HS60  Verbal informed consent obtained.  Time-out conducted.  Noted no overlying erythema, induration, or other signs of local infection.  Skin prepped in a sterile fashion.  Local anesthesia: Topical Ethyl chloride.  With sterile technique and under real time ultrasound guidance: Noted flexor tendon nodule and minimal flexor tendon sheath effusion, 25-gauge needle advanced into the flexor pollicis longus tendon sheath, I easily injected 1/2 cc lidocaine, 1/2 cc kenalog 40 Completed without difficulty  Advised to call if fevers/chills, erythema, induration, drainage, or persistent bleeding.  Images permanently stored and available for review in PACS.  Impression: Technically successful ultrasound guided injection.  Independent interpretation of notes and tests performed by another provider:   None.  Brief History, Exam, Impression, and Recommendations:    Trigger thumb, right thumb This is a very pleasant 60 year old female, has a several week history of increasing pain volar right thumb localized closer to the MCP. Occasional triggering. Does have a history sometime ago of a laceration that resulted in numbness along the radial aspect of the thumb distally to the MCP, likely cut the common digital nerve, should pass cells on that. Due to severity of pain and the likely diagnosis of a trigger thumb we did a flexor pollicis longus tendon sheath injection, adding home conditioning, return to see me in 6 weeks.    ____________________________________________ Ihor Austin. Benjamin Stain, M.D., ABFM., CAQSM., AME. Primary Care and Sports Medicine [facility]  Adjunct Professor of Family Medicine  [employer]  Restaurant manager, fast food

## 2022-08-16 ENCOUNTER — Ambulatory Visit: Payer: 59 | Admitting: Sports Medicine

## 2022-10-23 NOTE — Progress Notes (Unsigned)
   Established Patient Office Visit  Subjective   Patient ID: Lauren Velasquez, female   DOB: 04-24-1961 Age: 61 y.o. MRN: [id number]   No chief complaint on file.  HPI    Objective:    There were no vitals filed for this visit.  Physical Exam   No results found for this or Lauren previous visit (from the past 24 hour(s)).   {Labs (Optional):23779}  The 10-year ASCVD risk score (Arnett DK, et al., 2019) is: 3.2%   Values used to calculate the score:     Age: 57 years     Sex: Female     Is Non-Hispanic African American: No     Diabetic: No     Tobacco smoker: No     Systolic Blood Pressure: 117 mmHg     Is BP treated: No     HDL Cholesterol: 56 mg/dL     Total Cholesterol: 187 mg/dL   Assessment & Plan:   No problem-specific Assessment & Plan notes found for this encounter.   No follow-ups on file.  ___________________________________________ Thayer Ohm, DNP, APRN, FNP-BC Primary Care and Sports Medicine [facility]

## 2022-10-24 ENCOUNTER — Ambulatory Visit: Payer: 59 | Admitting: Medical-Surgical

## 2022-10-24 ENCOUNTER — Encounter: Payer: Self-pay | Admitting: Medical-Surgical

## 2022-10-24 VITALS — BP 118/69 | HR 62 | Resp 20 | Ht 60.0 in | Wt 132.8 lb

## 2022-10-24 DIAGNOSIS — E039 Hypothyroidism, unspecified: Secondary | ICD-10-CM

## 2022-10-24 DIAGNOSIS — E538 Deficiency of other specified B group vitamins: Secondary | ICD-10-CM

## 2022-10-24 DIAGNOSIS — Z720 Tobacco use: Secondary | ICD-10-CM

## 2022-10-24 DIAGNOSIS — E7849 Other hyperlipidemia: Secondary | ICD-10-CM

## 2022-10-24 DIAGNOSIS — M810 Age-related osteoporosis without current pathological fracture: Secondary | ICD-10-CM

## 2022-10-25 ENCOUNTER — Other Ambulatory Visit: Payer: Self-pay | Admitting: Medical-Surgical

## 2022-10-25 LAB — TSH: TSH: 1.27 mIU/L (ref 0.40–4.50)

## 2022-10-25 MED ORDER — LEVOTHYROXINE SODIUM 88 MCG PO TABS: 88.0000 ug | ORAL_TABLET | Freq: Every day | ORAL | 1 refills | Status: DC

## 2022-12-13 ENCOUNTER — Other Ambulatory Visit: Payer: Self-pay | Admitting: Physician Assistant

## 2023-02-24 ENCOUNTER — Other Ambulatory Visit: Payer: Self-pay | Admitting: Physician Assistant

## 2023-04-12 ENCOUNTER — Other Ambulatory Visit: Payer: Self-pay | Admitting: Medical-Surgical

## 2023-04-12 DIAGNOSIS — Z1231 Encounter for screening mammogram for malignant neoplasm of breast: Secondary | ICD-10-CM

## 2023-04-24 ENCOUNTER — Ambulatory Visit: Payer: 59 | Admitting: Medical-Surgical

## 2023-04-26 ENCOUNTER — Ambulatory Visit (INDEPENDENT_AMBULATORY_CARE_PROVIDER_SITE_OTHER): Payer: BC Managed Care – PPO | Admitting: Medical-Surgical

## 2023-04-26 ENCOUNTER — Encounter: Payer: Self-pay | Admitting: Medical-Surgical

## 2023-04-26 VITALS — BP 111/58 | HR 62 | Resp 20 | Ht 60.0 in | Wt 131.4 lb

## 2023-04-26 DIAGNOSIS — Z720 Tobacco use: Secondary | ICD-10-CM | POA: Diagnosis not present

## 2023-04-26 DIAGNOSIS — E039 Hypothyroidism, unspecified: Secondary | ICD-10-CM

## 2023-04-26 DIAGNOSIS — E7849 Other hyperlipidemia: Secondary | ICD-10-CM | POA: Diagnosis not present

## 2023-04-26 DIAGNOSIS — E538 Deficiency of other specified B group vitamins: Secondary | ICD-10-CM

## 2023-04-26 DIAGNOSIS — M810 Age-related osteoporosis without current pathological fracture: Secondary | ICD-10-CM

## 2023-04-26 MED ORDER — ATORVASTATIN CALCIUM 10 MG PO TABS: 10.0000 mg | ORAL_TABLET | Freq: Every day | ORAL | 3 refills | Status: DC

## 2023-04-26 NOTE — Progress Notes (Signed)
        Established patient visit  History, exam, impression, and plan:  1. Hypothyroidism, unspecified type Pleasant 62 year old female presenting for follow-up on hypothyroidism.  Due for labs today.  Currently taking levothyroxine 88 mcg daily, tolerating well without side effects.  No concerns with skin changes, hair loss, weight fluctuations, palpitations, constipation/diarrhea, or worsened anxiety.  On exam, alert and oriented with normal mood and affect.  HRR, BP well-controlled.  No peripheral edema.  Checking TSH today.  Depending on results, may titrate levothyroxine dosing. - TSH  2. B12 deficiency with macrocytosis, elevated methylmalonic acid.  Pending intrinsic factor antibody evaluation History of vitamin B12 deficiency.  She is currently taking vitamin B12 orally every other day and doing well.  Rechecking CBC with differential and vitamin B12 today. - CBC with Differential/Platelet - Vitamin B12  3. Other hyperlipidemia History of hyperlipidemia and has been taking atorvastatin 10 mg daily.  Tolerating the medication well without side effects.  Following a low-fat heart healthy diet.  Checking labs today.  Continue atorvastatin 10 mg daily.  Depending on results, may need to titrate dose. - COMPLETE METABOLIC PANEL WITH GFR - Lipid panel  4. Tobacco use Used to smoke but only when bored.  Has a new job which keeps her very busy and is helping with smoking cessation.  Able to go several days without cigarettes but does occasionally pick them up when she is sedentary with nothing else to keep her busy.  Aware of risks of smoking and benefits of cessation.  5. Age-related osteoporosis without current pathological fracture Last DEXA scan done in 03/2022.  T-score at that time showing osteopenia.  Has been taking Fosamax 70 mg weekly for approximately 4 years, tolerating well without side effects.  Doing exercise classes and to help with osteoporosis.  Continues to stay very active  and takes vitamin D as prescribed.  No recent falls, injuries, or fractures.  Continue Fosamax as prescribed.  Procedures performed this visit: None.  Return for pap smear at your convenience; 6 months for hypothyroidism.  __________________________________ Thayer Ohm, DNP, APRN, FNP-BC Primary Care and Sports Medicine [facility] [place]

## 2023-04-27 LAB — VITAMIN B12: Vitamin B-12: 517 pg/mL (ref 200–1100)

## 2023-04-27 LAB — CBC WITH DIFFERENTIAL/PLATELET
Absolute Monocytes: 314 cells/uL (ref 200–950)
Basophils Absolute: 51 cells/uL (ref 0–200)
Basophils Relative: 0.9 %
Eosinophils Absolute: 131 cells/uL (ref 15–500)
Eosinophils Relative: 2.3 %
HCT: 42.8 % (ref 35.0–45.0)
Hemoglobin: 14.3 g/dL (ref 11.7–15.5)
Lymphs Abs: 1562 cells/uL (ref 850–3900)
MCH: 30.8 pg (ref 27.0–33.0)
MCHC: 33.4 g/dL (ref 32.0–36.0)
MCV: 92 fL (ref 80.0–100.0)
MPV: 11.3 fL (ref 7.5–12.5)
Monocytes Relative: 5.5 %
Neutro Abs: 3642 cells/uL (ref 1500–7800)
Neutrophils Relative %: 63.9 %
Platelets: 262 10*3/uL (ref 140–400)
RBC: 4.65 10*6/uL (ref 3.80–5.10)
RDW: 13.2 % (ref 11.0–15.0)
Total Lymphocyte: 27.4 %
WBC: 5.7 10*3/uL (ref 3.8–10.8)

## 2023-04-27 LAB — COMPLETE METABOLIC PANEL WITH GFR
AG Ratio: 1.7 (calc) (ref 1.0–2.5)
ALT: 13 U/L (ref 6–29)
AST: 13 U/L (ref 10–35)
Albumin: 4.5 g/dL (ref 3.6–5.1)
Alkaline phosphatase (APISO): 88 U/L (ref 37–153)
BUN: 17 mg/dL (ref 7–25)
CO2: 30 mmol/L (ref 20–32)
Calcium: 9.8 mg/dL (ref 8.6–10.4)
Chloride: 103 mmol/L (ref 98–110)
Creat: 0.63 mg/dL (ref 0.50–1.05)
Globulin: 2.6 g/dL (calc) (ref 1.9–3.7)
Glucose, Bld: 95 mg/dL (ref 65–99)
Potassium: 4.8 mmol/L (ref 3.5–5.3)
Sodium: 141 mmol/L (ref 135–146)
Total Bilirubin: 0.4 mg/dL (ref 0.2–1.2)
Total Protein: 7.1 g/dL (ref 6.1–8.1)
eGFR: 100 mL/min/{1.73_m2} (ref >=60)

## 2023-04-27 LAB — LIPID PANEL
Cholesterol: 232 mg/dL — ABNORMAL HIGH (ref <200)
HDL: 55 mg/dL (ref >=50)
LDL Cholesterol (Calc): 154 mg/dL (calc) — ABNORMAL HIGH
Non-HDL Cholesterol (Calc): 177 mg/dL (calc) — ABNORMAL HIGH (ref <130)
Total CHOL/HDL Ratio: 4.2 (calc) (ref <5.0)
Triglycerides: 116 mg/dL (ref <150)

## 2023-04-27 LAB — TSH: TSH: 0.92 mIU/L (ref 0.40–4.50)

## 2023-04-29 ENCOUNTER — Other Ambulatory Visit: Payer: Self-pay | Admitting: Medical-Surgical

## 2023-05-17 ENCOUNTER — Ambulatory Visit (INDEPENDENT_AMBULATORY_CARE_PROVIDER_SITE_OTHER): Payer: BC Managed Care – PPO

## 2023-05-17 ENCOUNTER — Other Ambulatory Visit (HOSPITAL_COMMUNITY)
Admission: RE | Admit: 2023-05-17 | Discharge: 2023-05-17 | Disposition: A | Discharge status: Home / Self Care | Payer: BC Managed Care – PPO | Source: Ambulatory Visit | Attending: Medical-Surgical | Admitting: Medical-Surgical

## 2023-05-17 ENCOUNTER — Ambulatory Visit (INDEPENDENT_AMBULATORY_CARE_PROVIDER_SITE_OTHER): Payer: BC Managed Care – PPO | Admitting: Medical-Surgical

## 2023-05-17 ENCOUNTER — Encounter: Payer: Self-pay | Admitting: Medical-Surgical

## 2023-05-17 VITALS — BP 116/67 | HR 80 | Resp 20 | Ht 60.0 in | Wt 128.0 lb

## 2023-05-17 DIAGNOSIS — Z124 Encounter for screening for malignant neoplasm of cervix: Secondary | ICD-10-CM | POA: Diagnosis present

## 2023-05-17 DIAGNOSIS — Z1231 Encounter for screening mammogram for malignant neoplasm of breast: Secondary | ICD-10-CM

## 2023-05-17 NOTE — Progress Notes (Signed)
        Established patient visit  History, exam, impression, and plan:  1. Cervical cancer screening Pleasant 62 year old female presenting today to complete her Pap smear.  Reports all of her prior Pap smears have been normal.  Her last one was at least 3 years ago.  No current concerning symptoms or concern for STIs.  See below for exam findings.  Pap smear completed today with HPV cotesting.  If this is normal, reviewed recommendations for screening after the age of 14 and advised that this will be voluntary. - Cytology - PAP  During our appointment, she did update [place] on family history.  We have updated this in her file for future reference.  Procedures performed this visit: None.  Return if symptoms worsen or fail to improve.  I spent 20 minutes on the day of the encounter to include pre-visit record review, face-to-face time with the patient and post visit ordering of test.  __________________________________ Thayer Ohm, DNP, APRN, FNP-BC Primary Care and Sports Medicine [facility] [place]

## 2023-05-25 LAB — CYTOLOGY - PAP
Comment: NEGATIVE
Diagnosis: NEGATIVE
High risk HPV: NEGATIVE

## 2023-08-23 ENCOUNTER — Other Ambulatory Visit: Payer: Self-pay | Admitting: Medical-Surgical

## 2023-10-18 ENCOUNTER — Ambulatory Visit: Payer: 59 | Admitting: Medical-Surgical

## 2023-10-23 ENCOUNTER — Ambulatory Visit (INDEPENDENT_AMBULATORY_CARE_PROVIDER_SITE_OTHER): Payer: BC Managed Care – PPO | Admitting: Medical-Surgical

## 2023-10-23 ENCOUNTER — Encounter: Payer: Self-pay | Admitting: Medical-Surgical

## 2023-10-23 VITALS — BP 116/71 | HR 73 | Resp 20 | Ht 60.0 in | Wt 132.2 lb

## 2023-10-23 DIAGNOSIS — M810 Age-related osteoporosis without current pathological fracture: Secondary | ICD-10-CM

## 2023-10-23 DIAGNOSIS — E7849 Other hyperlipidemia: Secondary | ICD-10-CM

## 2023-10-23 DIAGNOSIS — Z720 Tobacco use: Secondary | ICD-10-CM

## 2023-10-23 DIAGNOSIS — E538 Deficiency of other specified B group vitamins: Secondary | ICD-10-CM

## 2023-10-23 DIAGNOSIS — E039 Hypothyroidism, unspecified: Secondary | ICD-10-CM

## 2023-10-23 NOTE — Progress Notes (Signed)
        Established patient visit  History, exam, impression, and plan:  1. Hypothyroidism, unspecified type Very pleasant 62 year old female presenting today for follow-up on hypothyroidism.  She has been taking levothyroxine 88 mcg daily, tolerating well without side effects.  No concerning symptoms today to report.  Feels that the medication has been working well for her.  Of note, she has been taking biotin over the last few months.  Advised that this may alter her TSH result a bit.  Plan to check TSH today.  Continue levothyroxine, titrating dose depending on result. - TSH  2. Tobacco use Continues to use tobacco and is aware of recommendations for smoking cessation.  Not interested in smoking cessation assistance at this time.  3. Other hyperlipidemia Has been taking Lipitor 10 mg daily, tolerating well without side effects.  Working to follow a low-fat heart healthy diet.  Stays physically active and is aware of recommendations for management of a healthy weight.  Checking labs today.  Continue Lipitor as prescribed. - Lipid panel - CMP14+EGFR  4. B12 deficiency with macrocytosis, elevated methylmalonic acid.  Pending intrinsic factor antibody evaluation History of vitamin B12 deficiency.  She is on oral replacement and feels that she is doing fairly well overall.  Rechecking vitamin B12 and CBC today. - Vitamin B12 - CBC  5. Age-related osteoporosis without current pathological fracture Up-to-date on DEXA scan.  She is taking Fosamax 70 mg once weekly, tolerating well without side effects.  No GI upset or heartburn like symptoms.  Will be due for her next DEXA scan in April 2025.  Continue Fosamax as prescribed.   Review of Systems  Constitutional:  Negative for chills, fever and malaise/fatigue.  Respiratory:  Negative for cough, shortness of breath and wheezing.   Cardiovascular:  Negative for chest pain, palpitations and leg swelling.  Neurological:  Negative for dizziness  and headaches.  Psychiatric/Behavioral:  Negative for depression and suicidal ideas. The patient is not nervous/anxious and does not have insomnia.     Physical Exam Vitals reviewed.  Constitutional:      General: She is not in acute distress.    Appearance: Normal appearance. She is not ill-appearing.  HENT:     Head: Normocephalic and atraumatic.  Cardiovascular:     Rate and Rhythm: Normal rate and regular rhythm.     Pulses: Normal pulses.     Heart sounds: Normal heart sounds. No murmur heard.    No friction rub. No gallop.  Pulmonary:     Effort: Pulmonary effort is normal. No respiratory distress.     Breath sounds: Normal breath sounds. No wheezing.  Skin:    General: Skin is warm and dry.  Neurological:     Mental Status: She is alert and oriented to person, place, and time.  Psychiatric:        Mood and Affect: Mood normal.        Behavior: Behavior normal.        Thought Content: Thought content normal.        Judgment: Judgment normal.   Procedures performed this visit: None.  Return in about 1 year (around 10/22/2024) for chronic disease follow up or sooner if needed.  __________________________________ Thayer Ohm, DNP, APRN, FNP-BC Primary Care and Sports Medicine [facility] [employer]

## 2023-10-24 LAB — CBC
Hematocrit: 43.4 % (ref 34.0–46.6)
Hemoglobin: 14.1 g/dL (ref 11.1–15.9)
MCH: 31.3 pg (ref 26.6–33.0)
MCHC: 32.5 g/dL (ref 31.5–35.7)
MCV: 96 fL (ref 79–97)
Platelets: 263 10*3/uL (ref 150–450)
RBC: 4.51 x10E6/uL (ref 3.77–5.28)
RDW: 13.1 % (ref 11.7–15.4)
WBC: 7.4 10*3/uL (ref 3.4–10.8)

## 2023-10-24 LAB — TSH: TSH: 1.4 u[IU]/mL (ref 0.450–4.500)

## 2023-10-24 LAB — CMP14+EGFR
ALT: 16 [IU]/L (ref 0–32)
AST: 14 [IU]/L (ref 0–40)
Albumin: 4.4 g/dL (ref 3.9–4.9)
Alkaline Phosphatase: 109 [IU]/L (ref 44–121)
BUN/Creatinine Ratio: 14 (ref 12–28)
BUN: 10 mg/dL (ref 8–27)
Bilirubin Total: 0.2 mg/dL (ref 0.0–1.2)
CO2: 24 mmol/L (ref 20–29)
Calcium: 9.1 mg/dL (ref 8.7–10.3)
Chloride: 103 mmol/L (ref 96–106)
Creatinine, Ser: 0.69 mg/dL (ref 0.57–1.00)
Globulin, Total: 2.6 g/dL (ref 1.5–4.5)
Glucose: 93 mg/dL (ref 70–99)
Potassium: 4.4 mmol/L (ref 3.5–5.2)
Sodium: 141 mmol/L (ref 134–144)
Total Protein: 7 g/dL (ref 6.0–8.5)
eGFR: 97 mL/min/{1.73_m2} (ref >59)

## 2023-10-24 LAB — LIPID PANEL
Chol/HDL Ratio: 4.5 ratio — ABNORMAL HIGH (ref 0.0–4.4)
Cholesterol, Total: 225 mg/dL — ABNORMAL HIGH (ref 100–199)
HDL: 50 mg/dL (ref >39)
LDL Chol Calc (NIH): 121 mg/dL — ABNORMAL HIGH (ref 0–99)
Triglycerides: 310 mg/dL — ABNORMAL HIGH (ref 0–149)
VLDL Cholesterol Cal: 54 mg/dL — ABNORMAL HIGH (ref 5–40)

## 2023-10-24 LAB — VITAMIN B12: Vitamin B-12: 431 pg/mL (ref 232–1245)

## 2023-11-02 MED ORDER — ATORVASTATIN CALCIUM 20 MG PO TABS: 20.0000 mg | ORAL_TABLET | Freq: Every day | ORAL | 3 refills | Status: DC

## 2023-11-02 NOTE — Addendum Note (Signed)
Addended byChristen Butter on: 11/02/2023 07:48 AM   Modules accepted: Orders

## 2023-11-23 ENCOUNTER — Other Ambulatory Visit: Payer: Self-pay | Admitting: Medical-Surgical

## 2024-01-02 ENCOUNTER — Telehealth: Payer: Self-pay | Admitting: Medical-Surgical

## 2024-01-02 NOTE — Telephone Encounter (Signed)
Please see note below.  Copied from CRM [id number]. Topic: Clinical - Medication Question >> Jan 02, 2024 10:40 AM Prudencio Pair wrote: Reason for CRM: Patient states she has a slight cold. States her daughter told her that she could take Sudafed but the package states if there is thyroid issues to speak with a medical professional. Patient states she does have thyroid issues & would like advice before taking the medication. Patient also states she currently does not have insurance to be seen for an office visit & would like for a nurse or provider to give her a call to advise. CB #: [id number].

## 2024-01-04 NOTE — Telephone Encounter (Signed)
Lm for pt with Lauren jessup,NP recommendations

## 2024-01-05 ENCOUNTER — Other Ambulatory Visit: Payer: Self-pay | Admitting: Medical-Surgical

## 2024-05-28 ENCOUNTER — Other Ambulatory Visit: Payer: Self-pay | Admitting: Medical-Surgical

## 2024-07-21 ENCOUNTER — Other Ambulatory Visit: Payer: Self-pay | Admitting: Medical-Surgical

## 2024-08-13 ENCOUNTER — Encounter: Payer: Self-pay | Admitting: Sports Medicine

## 2024-08-15 ENCOUNTER — Other Ambulatory Visit: Payer: Self-pay | Admitting: Medical-Surgical

## 2024-09-18 ENCOUNTER — Other Ambulatory Visit: Payer: Self-pay | Admitting: Medical-Surgical

## 2024-09-18 DIAGNOSIS — Z1231 Encounter for screening mammogram for malignant neoplasm of breast: Secondary | ICD-10-CM

## 2024-09-19 ENCOUNTER — Encounter (HOSPITAL_BASED_OUTPATIENT_CLINIC_OR_DEPARTMENT_OTHER): Payer: Self-pay

## 2024-09-19 ENCOUNTER — Ambulatory Visit (HOSPITAL_BASED_OUTPATIENT_CLINIC_OR_DEPARTMENT_OTHER)
Admission: RE | Admit: 2024-09-19 | Discharge: 2024-09-19 | Disposition: A | Discharge status: Home / Self Care | Payer: Self-pay | Source: Ambulatory Visit | Attending: Medical-Surgical | Admitting: Medical-Surgical

## 2024-09-19 DIAGNOSIS — Z1231 Encounter for screening mammogram for malignant neoplasm of breast: Secondary | ICD-10-CM | POA: Insufficient documentation

## 2024-09-21 ENCOUNTER — Other Ambulatory Visit: Payer: Self-pay | Admitting: Medical-Surgical

## 2024-09-23 ENCOUNTER — Ambulatory Visit: Payer: Self-pay | Admitting: Medical-Surgical

## 2024-10-12 ENCOUNTER — Other Ambulatory Visit: Payer: Self-pay | Admitting: Medical-Surgical

## 2024-10-22 ENCOUNTER — Ambulatory Visit: Payer: BC Managed Care – PPO | Admitting: Medical-Surgical

## 2024-10-22 ENCOUNTER — Encounter: Payer: Self-pay | Admitting: Medical-Surgical

## 2024-10-22 VITALS — BP 162/68 | HR 58 | Resp 20 | Ht 60.0 in | Wt 135.0 lb

## 2024-10-22 DIAGNOSIS — E7849 Other hyperlipidemia: Secondary | ICD-10-CM | POA: Diagnosis not present

## 2024-10-22 DIAGNOSIS — R490 Dysphonia: Secondary | ICD-10-CM

## 2024-10-22 DIAGNOSIS — M810 Age-related osteoporosis without current pathological fracture: Secondary | ICD-10-CM | POA: Diagnosis not present

## 2024-10-22 DIAGNOSIS — Z720 Tobacco use: Secondary | ICD-10-CM

## 2024-10-22 DIAGNOSIS — R030 Elevated blood-pressure reading, without diagnosis of hypertension: Secondary | ICD-10-CM

## 2024-10-22 DIAGNOSIS — E538 Deficiency of other specified B group vitamins: Secondary | ICD-10-CM

## 2024-10-22 DIAGNOSIS — E039 Hypothyroidism, unspecified: Secondary | ICD-10-CM | POA: Diagnosis not present

## 2024-10-22 NOTE — Progress Notes (Unsigned)
 Established Patient Office Visit  Subjective   Patient ID: Lauren Velasquez, female    DOB: [birth year]  Age: 63 y.o. MRN: [id number]  Chief Complaint  Patient presents with   Hypertension   Hyperlipidemia   Hypothyroidism    63 year old female presents for a 6 month follow up on the following medical conditions:  Hypothyroidism Patient is currently taking 88 mcg of synthroid . She is doing well on current dosage. Denies side effects to medications. Last TSH 10/2023. Will plan to recheck levels today  Hyperlipidemia Patient is currently taking atorvastatin  20 mg daily. Denies having any side effects to her medications. Last Lipid Panel was checked 10/2023.  Osteoporosis Patient is currently taking alendronate  sodium 70 mg weekly. Doing well with medication. Last Dexa scan 03/2022. Patient is due for updated DEXA scan.  Tobacco use Patient states that she has now cut down to smoking 1 cigarette may 1-2 per week.   Review of Systems  Constitutional: Negative.   HENT: Negative.    Eyes: Negative.   Respiratory: Negative.    Cardiovascular: Negative.   Gastrointestinal: Negative.   Genitourinary: Negative.   Musculoskeletal: Negative.   Skin: Negative.   Neurological: Negative.   Endo/Heme/Allergies: Negative.   Psychiatric/Behavioral: Negative.        Objective:     BP (!) 145/75 (BP Location: Left Arm, Cuff Size: Normal)   Pulse 64   Resp 20   Ht 5' (1.524 m)   Wt 61.3 kg   SpO2 96%   BMI 26.37 kg/m  BP Readings from Last 3 Encounters:  10/22/24 (!) 145/75  10/23/23 116/71  05/17/23 116/67   Wt Readings from Last 3 Encounters:  10/22/24 61.3 kg  10/23/23 60 kg  05/17/23 58.1 kg      Physical Exam Vitals and nursing note reviewed.  Constitutional:      General: She is not in acute distress.    Appearance: Normal appearance.  Cardiovascular:     Rate and Rhythm: Normal rate and regular rhythm.     Pulses: Normal pulses.     Heart  sounds: Normal heart sounds.  Pulmonary:     Effort: Pulmonary effort is normal.     Breath sounds: Normal breath sounds.  Neurological:     General: No focal deficit present.     Mental Status: She is alert and oriented to person, place, and time.  Psychiatric:        Mood and Affect: Mood normal.        Behavior: Behavior normal.        Thought Content: Thought content normal.        Judgment: Judgment normal.      No results found for any visits on 10/22/24.  Last CBC Lab Results  Component Value Date   WBC 7.4 10/23/2023   HGB 14.1 10/23/2023   HCT 43.4 10/23/2023   MCV 96 10/23/2023   MCH 31.3 10/23/2023   RDW 13.1 10/23/2023   PLT 263 10/23/2023   Last metabolic panel Lab Results  Component Value Date   GLUCOSE 93 10/23/2023   NA 141 10/23/2023   K 4.4 10/23/2023   CL 103 10/23/2023   CO2 24 10/23/2023   BUN 10 10/23/2023   CREATININE 0.69 10/23/2023   EGFR 97 10/23/2023   CALCIUM  9.1 10/23/2023   PROT 7.0 10/23/2023   ALBUMIN 4.4 10/23/2023   LABGLOB 2.6 10/23/2023   BILITOT 0.2 10/23/2023   ALKPHOS 109 10/23/2023   AST 14  10/23/2023   ALT 16 10/23/2023   Last lipids Lab Results  Component Value Date   CHOL 225 (H) 10/23/2023   HDL 50 10/23/2023   LDLCALC 121 (H) 10/23/2023   TRIG 310 (H) 10/23/2023   CHOLHDL 4.5 (H) 10/23/2023   Last hemoglobin A1c No results found for: HGBA1C Last thyroid functions Lab Results  Component Value Date   TSH 1.400 10/23/2023   Last vitamin D  Lab Results  Component Value Date   VD25OH 33 03/16/2022   Last vitamin B12 and Folate Lab Results  Component Value Date   VITAMINB12 431 10/23/2023   FOLATE >24.0 07/02/2020      The 10-year ASCVD risk score (Arnett DK, et al., 2019) is: 7.1%    Assessment & Plan:   1. Hypothyroidism, unspecified type (Primary) -Continue on current dose of 88 mcg levothyroxine  -Recheck TSH today  2. Other hyperlipidemia -Continue taking atorvastatin  20 mg  daily -Recheck Lipid Panel today  3. Age-related osteoporosis without current pathological fracture -Continue on Fosamax  70 mg weekly  4. Tobacco use -Continue working on smoking cessation -Patient is down to smoking down 1-2 cigarettes weekly  5. Hoarseness of voice -Dentist concerned about hoarseness in voice -Discussed repeating labs, and referral to ENT for further evaluation -Patient declined referral at this time  6. B12 deficiency with macrocytosis, elevated methylmalonic acid.  Pending intrinsic factor antibody evaluation -Recheck levels today -Will discuss need for continued supplementation pending lab results - CBC with Differential - B12  7. Elevated BP without diagnosis of hypertension -Initial BP 145/75 and repeat 162/68 -Recommend follow up in 2 weeks for a BP recheck -Will consider initiating antihypertensive therapy if BP continues to be elevated -Advised patient to monitor BP at home to compare to office readings -Discussed dietary changes such as decreasing salt intake, increasing water intake, smoking cessation, and exercise.    Return in about 6 months (around 04/21/2025).   Derrek JINNY Freund, NP Student

## 2024-10-23 ENCOUNTER — Ambulatory Visit: Payer: Self-pay | Admitting: Medical-Surgical

## 2024-10-23 LAB — CMP14+EGFR
ALT: 20 IU/L (ref 0–32)
AST: 18 IU/L (ref 0–40)
Albumin: 4.7 g/dL (ref 3.9–4.9)
Alkaline Phosphatase: 102 IU/L (ref 49–135)
BUN/Creatinine Ratio: 17 (ref 12–28)
BUN: 11 mg/dL (ref 8–27)
Bilirubin Total: 0.4 mg/dL (ref 0.0–1.2)
CO2: 23 mmol/L (ref 20–29)
Calcium: 9.4 mg/dL (ref 8.7–10.3)
Chloride: 100 mmol/L (ref 96–106)
Creatinine, Ser: 0.65 mg/dL (ref 0.57–1.00)
Globulin, Total: 2.2 g/dL (ref 1.5–4.5)
Glucose: 93 mg/dL (ref 70–99)
Potassium: 4.4 mmol/L (ref 3.5–5.2)
Sodium: 139 mmol/L (ref 134–144)
Total Protein: 6.9 g/dL (ref 6.0–8.5)
eGFR: 98 mL/min/1.73 (ref >59)

## 2024-10-23 LAB — CBC WITH DIFFERENTIAL/PLATELET
Basophils Absolute: 0.1 x10E3/uL (ref 0.0–0.2)
Basos: 1 %
EOS (ABSOLUTE): 0.1 x10E3/uL (ref 0.0–0.4)
Eos: 2 %
Hematocrit: 43.9 % (ref 34.0–46.6)
Hemoglobin: 14.7 g/dL (ref 11.1–15.9)
Immature Grans (Abs): 0 x10E3/uL (ref 0.0–0.1)
Immature Granulocytes: 0 %
Lymphocytes Absolute: 1.9 x10E3/uL (ref 0.7–3.1)
Lymphs: 29 %
MCH: 31.7 pg (ref 26.6–33.0)
MCHC: 33.5 g/dL (ref 31.5–35.7)
MCV: 95 fL (ref 79–97)
Monocytes Absolute: 0.4 x10E3/uL (ref 0.1–0.9)
Monocytes: 6 %
Neutrophils Absolute: 3.9 x10E3/uL (ref 1.4–7.0)
Neutrophils: 61 %
Platelets: 278 x10E3/uL (ref 150–450)
RBC: 4.63 x10E6/uL (ref 3.77–5.28)
RDW: 13.3 % (ref 11.7–15.4)
WBC: 6.5 x10E3/uL (ref 3.4–10.8)

## 2024-10-23 LAB — TSH+FREE T4
Free T4: 1.34 ng/dL (ref 0.82–1.77)
TSH: 1.26 u[IU]/mL (ref 0.450–4.500)

## 2024-10-23 LAB — LIPID PANEL
Chol/HDL Ratio: 4 ratio (ref 0.0–4.4)
Cholesterol, Total: 226 mg/dL — ABNORMAL HIGH (ref 100–199)
HDL: 56 mg/dL (ref >39)
LDL Chol Calc (NIH): 149 mg/dL — ABNORMAL HIGH (ref 0–99)
Triglycerides: 119 mg/dL (ref 0–149)
VLDL Cholesterol Cal: 21 mg/dL (ref 5–40)

## 2024-10-23 LAB — VITAMIN B12: Vitamin B-12: 969 pg/mL (ref 232–1245)

## 2024-10-23 NOTE — Progress Notes (Signed)
 Medical screening examination/treatment was performed by qualified clinical staff member and as supervising provider I was immediately available for consultation/collaboration. I have reviewed documentation and agree with assessment and plan.  Thayer Ohm, DNP, APRN, FNP-BC [facility]

## 2024-10-29 MED ORDER — ATORVASTATIN CALCIUM 40 MG PO TABS: 40.0000 mg | ORAL_TABLET | Freq: Every day | ORAL | 3 refills | Status: AC

## 2024-10-29 NOTE — Addendum Note (Signed)
 Addended byBETHA WILLO MINI on: 10/29/2024 04:03 PM   Modules accepted: Orders

## 2024-11-05 ENCOUNTER — Ambulatory Visit (INDEPENDENT_AMBULATORY_CARE_PROVIDER_SITE_OTHER): Admitting: Medical-Surgical

## 2024-11-05 VITALS — BP 146/79 | HR 59 | Ht 60.0 in | Wt 135.0 lb

## 2024-11-05 DIAGNOSIS — I10 Essential (primary) hypertension: Secondary | ICD-10-CM

## 2024-11-05 NOTE — Progress Notes (Signed)
 Patient is here for blood pressure check. Denies chest pain, dizziness, shortness of breath, severe headache, or nosebleeds.  Taking medication as prescribed. Denies missed doses.   Patient is currently unemployed and has found it difficulty to find employment. Unable to afford additional testing, immunizations, or labs.   She's started an exercise regimen and walks at least 6 days a week.  Previous BP was 162/68  1st BP today: 149/69  2nd BP today (after 10  minutes): 146/79   Patient has been monitoring BP at home: 11/12 am:  120/74 11/12 pm:  168/90 11/13 am:  93/52 11/13 pm:  135/74 11/14 am:  115/70 11/15 am:  128/62 11/16 am:  134/70 11/16 pm:  135/73 11/17 pm:  142/73 11/18 am:  116/63 11/19 am:  120/60 11/19 pm:  157/81 11/20 am:  142/76 11/20 pm:  138/72 11/21 am:  130/68 11/22 am:  129/67 11/23 am:  119/62 11/23 pm:  117/72 11/24 am:  124/64  Per [place], pt has been advised to monitor at home. If BP stays elevated contact office for appointment and possible medication intervention. Patient agrees to treatment plan.

## 2024-11-05 NOTE — Progress Notes (Signed)
 Medical screening examination/treatment was performed by qualified clinical staff member and as supervising provider I was immediately available for consultation/collaboration. I have reviewed documentation and agree with assessment and plan.  Thayer Ohm, DNP, APRN, FNP-BC [facility]

## 2024-11-07 ENCOUNTER — Other Ambulatory Visit: Payer: Self-pay | Admitting: Medical-Surgical

## 2024-11-30 ENCOUNTER — Other Ambulatory Visit: Payer: Self-pay | Admitting: Medical-Surgical
